# Patient Record
Sex: Female | Born: 1945 | Race: White | Hispanic: No | Smoking: Former smoker
Health system: Southern US, Community
[De-identification: ages and names within clinical notes are randomized; demographics above are authoritative.]

## PROBLEM LIST (undated history)

## (undated) DIAGNOSIS — E119 Type 2 diabetes mellitus without complications: Secondary | ICD-10-CM

## (undated) DIAGNOSIS — I10 Essential (primary) hypertension: Secondary | ICD-10-CM

## (undated) HISTORY — PX: CORONARY ARTERY BYPASS GRAFT: SHX141

## (undated) HISTORY — PX: ABDOMINAL HYSTERECTOMY: SHX81

## (undated) HISTORY — PX: CHOLECYSTECTOMY: SHX55

---

## 2018-07-22 DIAGNOSIS — E119 Type 2 diabetes mellitus without complications: Secondary | ICD-10-CM | POA: Insufficient documentation

## 2018-07-22 DIAGNOSIS — R0789 Other chest pain: Secondary | ICD-10-CM | POA: Insufficient documentation

## 2018-07-22 DIAGNOSIS — Z951 Presence of aortocoronary bypass graft: Secondary | ICD-10-CM | POA: Insufficient documentation

## 2018-07-22 DIAGNOSIS — Z87891 Personal history of nicotine dependence: Secondary | ICD-10-CM | POA: Diagnosis not present

## 2018-07-22 DIAGNOSIS — I10 Essential (primary) hypertension: Secondary | ICD-10-CM | POA: Insufficient documentation

## 2018-07-23 ENCOUNTER — Emergency Department (HOSPITAL_COMMUNITY)
Admission: EM | Admit: 2018-07-23 | Discharge: 2018-07-23 | Disposition: A | Discharge status: Home / Self Care | Payer: Medicare Other | Attending: Emergency Medicine | Admitting: Emergency Medicine

## 2018-07-23 ENCOUNTER — Encounter (HOSPITAL_COMMUNITY): Payer: Self-pay | Admitting: *Deleted

## 2018-07-23 ENCOUNTER — Other Ambulatory Visit: Payer: Self-pay

## 2018-07-23 ENCOUNTER — Emergency Department (HOSPITAL_COMMUNITY): Payer: Medicare Other

## 2018-07-23 DIAGNOSIS — R0789 Other chest pain: Secondary | ICD-10-CM | POA: Diagnosis not present

## 2018-07-23 HISTORY — DX: Type 2 diabetes mellitus without complications: E11.9

## 2018-07-23 HISTORY — DX: Essential (primary) hypertension: I10

## 2018-07-23 MED ORDER — METHOCARBAMOL 500 MG PO TABS: 500.0000 mg | ORAL_TABLET | Freq: Three times a day (TID) | ORAL | 0 refills | Status: AC | PRN

## 2018-07-23 MED ORDER — TRAMADOL HCL 50 MG PO TABS
50.0000 mg | ORAL_TABLET | Freq: Once | ORAL | Status: AC
  Administered 2018-07-23: 50 mg via ORAL
  Filled 2018-07-23: qty 1

## 2018-07-23 NOTE — ED Provider Notes (Signed)
[facility] EMERGENCY DEPARTMENT Provider Note   CSN: [id number] Arrival date & time: 07/22/18  2345  Time seen 1:40 AM   History   Chief Complaint Chief Complaint  Patient presents with  . Back Pain    HPI Heather Le is a 72 y.o. female.  HPI patient states she had pain in her right posterior chest area on December 23.  She states it is a dull pain in last 20 seconds and comes and goes.  She states she has had this many times before and she takes tramadol and it goes away.  She states she started getting a left sided chest pain posteriorly or in the same area but on the other side about 9 PM that woke her up from sleep.  She states it is stabbing and sharp and last about 10 seconds.  She denies feeling short of breath with it.  She states nothing she does makes it worse, nothing she does makes it better.  She denies any change in her activity or any known injury.  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  She denies hematuria, dysuria but she does have chronic frequency since she had a hysterectomy 2016.  She denies sore throat, rhinorrhea, she has had nausea without vomiting or diarrhea.  She also describes she has had a cough.  Patient states she normally takes tramadol for these pains however she took her last tramadol on the 24th.  She states if she had her tramadol at home she probably would not of come to the ED.  She states she normally has a blood pressure in the 1 30-1 45 range over 80.  She states tonight she took her blood pressure and it was 227/109.  She states she took her blood pressure medications today as usual, enalapril and Coreg.  She also takes glipizide for her diabetes.  PCP Monico Blitz, MD   Past Medical History:  Diagnosis Date  . Diabetes mellitus without complication ([place])   . Hypertension     There are no active problems to display for this patient.   Past Surgical History:  Procedure Laterality Date  . ABDOMINAL HYSTERECTOMY    .  CHOLECYSTECTOMY    . CORONARY ARTERY BYPASS GRAFT       OB History   No obstetric history on file.      Home Medications    Enalapril, Coreg, glipizide   Prior to Admission medications   Medication Sig Start Date End Date Taking? Authorizing Provider  methocarbamol (ROBAXIN) 500 MG tablet Take 1 tablet (500 mg total) by mouth every 8 (eight) hours as needed for muscle spasms. 07/23/18   Rolland Porter, MD    Family History History reviewed. No pertinent family history.  Social History Social History   Tobacco Use  . Smoking status: Former Research scientist (life sciences)  . Smokeless tobacco: Never Used  Substance Use Topics  . Alcohol use: Not Currently  . Drug use: Not Currently  Lives with her daughter   Allergies   Codeine; Lortab [hydrocodone-acetaminophen]; and Penicillins   Review of Systems Review of Systems  All other systems reviewed and are negative.    Physical Exam Updated Vital Signs BP (!) 166/91   Pulse 68   Temp 97.7 F (36.5 C) (Oral)   Resp 20   Ht 5\' 7"  (1.702 m)   Wt 89.8 kg   SpO2 98%   BMI 31.01 kg/m   Vital signs normal except mild hypertension   Physical  Exam Vitals signs and nursing note reviewed.  Constitutional:      General: She is not in acute distress.    Appearance: Normal appearance. She is well-developed. She is not ill-appearing or toxic-appearing.  HENT:     Head: Normocephalic and atraumatic.     Right Ear: External ear normal.     Left Ear: External ear normal.     Nose: Nose normal. No mucosal edema or rhinorrhea.     Mouth/Throat:     Dentition: No dental abscesses.     Pharynx: No uvula swelling.  Eyes:     Conjunctiva/sclera: Conjunctivae normal.     Pupils: Pupils are equal, round, and reactive to light.  Neck:     Musculoskeletal: Full passive range of motion without pain, normal range of motion and neck supple.  Cardiovascular:     Rate and Rhythm: Normal rate and regular rhythm.     Heart sounds: Normal heart sounds. No  murmur. No friction rub. No gallop.   Pulmonary:     Effort: Pulmonary effort is normal. No respiratory distress.     Breath sounds: Normal breath sounds. No wheezing, rhonchi or rales.       Comments: Patient's area of pain is noted and she has some mild tenderness to palpation.  There is no rash seen to suggest shingles. Chest:     Chest wall: No tenderness or crepitus.  Abdominal:     General: Bowel sounds are normal. There is no distension.     Palpations: Abdomen is soft.     Tenderness: There is no abdominal tenderness. There is no guarding or rebound.  Musculoskeletal: Normal range of motion.        General: No tenderness.     Comments: Moves all extremities well.   Skin:    General: Skin is warm and dry.     Coloration: Skin is not pale.     Findings: No erythema or rash.  Neurological:     Mental Status: She is alert and oriented to person, place, and time.     Cranial Nerves: No cranial nerve deficit.  Psychiatric:        Mood and Affect: Mood is not anxious.        Speech: Speech normal.        Behavior: Behavior normal.      ED Treatments / Results  Labs (all labs ordered are listed, but only abnormal results are displayed) Labs Reviewed - No data to display  EKG None  Radiology Dg Chest 2 View  Result Date: 07/23/2018 CLINICAL DATA:  Acute onset of left posterior chest pain. High blood pressure. EXAM: CHEST - 2 VIEW COMPARISON:  None. FINDINGS: The lungs are well-aerated. Vascular congestion is noted. Minimal right midlung and left basilar airspace opacities may reflect atelectasis or possibly mild infection. There is no evidence of pleural effusion or pneumothorax. The heart is normal in size; the patient is status post median sternotomy. No acute osseous abnormalities are seen. IMPRESSION: Vascular congestion noted. Minimal right midlung and left basilar airspace opacities may reflect atelectasis or possibly mild infection. Electronically Signed   By: Garald Balding M.D.   On: 07/23/2018 02:31    Procedures Procedures (including critical care time)  Medications Ordered in ED Medications  traMADol (ULTRAM) tablet 50 mg (50 mg Oral Given 07/23/18 0158)     Initial Impression / Assessment and Plan / ED Course  I have reviewed the triage vital signs and the nursing notes.  Pertinent labs & imaging results that were available during my care of the patient were reviewed by me and considered in my medical decision making (see chart for details).   Review of the [place] shows patient only has one prescription for tramadol # 60 tablets prescribed in September.  Due to the location for pain chest x-ray was done, patient was given tramadol in the ED.  When I checked her blood pressure during my exam it was 166/91 without treatment.  4:15 AM we discussed her chest x-ray.  She states she is only had a minimal cough no fever no shortness of breath.  She does not feel like she needs antibiotic at this time, she states she feels like she is having a lot of allergy symptoms.  She states her pain is better with her usual tramadol.  Her pain is very atypical and at this point I do not feel any further testing is indicated.  Final Clinical Impressions(s) / ED Diagnoses   Final diagnoses:  Posterior chest pain    ED Discharge Orders         Ordered    methocarbamol (ROBAXIN) 500 MG tablet  Every 8 hours PRN     07/23/18 0430         Plan discharge  Rolland Porter, MD, Barbette Or, MD 07/23/18 [phone]

## 2018-07-23 NOTE — Discharge Instructions (Addendum)
Take the medication as prescribed.  If you feel like you need to be back on your tramadol, please discuss this with Dr. Brigitte Pulse.  Recheck if you get fever, shortness of breath, your cough gets worse, or your pain gets more constant.

## 2018-07-23 NOTE — ED Triage Notes (Signed)
Pt c/o mid back pain for the last couple of days that has gotten progressively worse; pt states her BP has been elevated at home; BP at home was 227/109

## 2018-07-24 ENCOUNTER — Encounter (HOSPITAL_COMMUNITY): Payer: Self-pay | Admitting: Emergency Medicine

## 2018-07-24 ENCOUNTER — Emergency Department (HOSPITAL_COMMUNITY)
Admission: EM | Admit: 2018-07-24 | Discharge: 2018-07-24 | Disposition: A | Discharge status: Home / Self Care | Payer: Medicare Other | Attending: Emergency Medicine | Admitting: Emergency Medicine

## 2018-07-24 ENCOUNTER — Other Ambulatory Visit: Payer: Self-pay

## 2018-07-24 ENCOUNTER — Emergency Department (HOSPITAL_COMMUNITY): Payer: Medicare Other

## 2018-07-24 DIAGNOSIS — M545 Low back pain, unspecified: Secondary | ICD-10-CM

## 2018-07-24 DIAGNOSIS — I10 Essential (primary) hypertension: Secondary | ICD-10-CM | POA: Diagnosis not present

## 2018-07-24 DIAGNOSIS — M549 Dorsalgia, unspecified: Secondary | ICD-10-CM | POA: Diagnosis present

## 2018-07-24 DIAGNOSIS — E119 Type 2 diabetes mellitus without complications: Secondary | ICD-10-CM | POA: Diagnosis not present

## 2018-07-24 DIAGNOSIS — Z87891 Personal history of nicotine dependence: Secondary | ICD-10-CM | POA: Diagnosis not present

## 2018-07-24 DIAGNOSIS — R1084 Generalized abdominal pain: Secondary | ICD-10-CM | POA: Diagnosis not present

## 2018-07-24 LAB — URINALYSIS, ROUTINE W REFLEX MICROSCOPIC
Bilirubin Urine: NEGATIVE
Glucose, UA: NEGATIVE mg/dL
Ketones, ur: NEGATIVE mg/dL
Nitrite: NEGATIVE
Protein, ur: NEGATIVE mg/dL
Specific Gravity, Urine: 1.009 (ref 1.005–1.030)
pH: 5 (ref 5.0–8.0)

## 2018-07-24 MED ORDER — TRAMADOL HCL 50 MG PO TABS: 50.0000 mg | ORAL_TABLET | Freq: Four times a day (QID) | ORAL | 0 refills | Status: AC | PRN

## 2018-07-24 MED ORDER — TRAMADOL HCL 50 MG PO TABS
50.0000 mg | ORAL_TABLET | Freq: Once | ORAL | Status: AC
  Administered 2018-07-24: 50 mg via ORAL
  Filled 2018-07-24: qty 1

## 2018-07-24 NOTE — ED Provider Notes (Signed)
Work-up for the back pain without any significant abnormalities.  Will treat with a short course of Ultram until patient can follow-up with her doctor.  Urinalysis not consistent with urinary tract infection did have some white blood cells so sent for culture.  She will be notified if it grows a significant amount of bacteria.   Fredia Sorrow, MD 07/24/18 [phone]

## 2018-07-24 NOTE — ED Triage Notes (Signed)
Pt was seen 12/25 for lower back pain and states the ultram does not help her back pain. She states she wants some more of the med she received while she was here.

## 2018-07-24 NOTE — ED Provider Notes (Signed)
[facility] EMERGENCY DEPARTMENT Provider Note   CSN: [id number] Arrival date & time: 07/24/18  4081     History   Chief Complaint Chief Complaint  Patient presents with  . Back Pain    HPI Heather Le is a 72 y.o. female.  HPI Patient presents with left-sided mid back pain.  Seen 2 days ago for same.  Had previously been on Ultram for it.  Has had some pain on the right side for years but states now is on the left side.  Had been on both sides 2 days ago.  Normally Ultram will help but when she was here 2 days ago states it only partially relieves pain.  Given Robaxin and continued pain.  No fevers.  No dysuria.  No difficulty breathing.  He has had pains like this before but usually the pain is on the right side.  No diarrhea or constipation. Past Medical History:  Diagnosis Date  . Diabetes mellitus without complication ([place])   . Hypertension     There are no active problems to display for this patient.   Past Surgical History:  Procedure Laterality Date  . ABDOMINAL HYSTERECTOMY    . CHOLECYSTECTOMY    . CORONARY ARTERY BYPASS GRAFT       OB History   No obstetric history on file.      Home Medications    Prior to Admission medications   Medication Sig Start Date End Date Taking? Authorizing Provider  methocarbamol (ROBAXIN) 500 MG tablet Take 1 tablet (500 mg total) by mouth every 8 (eight) hours as needed for muscle spasms. 07/23/18   Rolland Porter, MD  traMADol (ULTRAM) 50 MG tablet Take 1 tablet (50 mg total) by mouth every 6 (six) hours as needed. 07/24/18   Fredia Sorrow, MD    Family History No family history on file.  Social History Social History   Tobacco Use  . Smoking status: Former Research scientist (life sciences)  . Smokeless tobacco: Never Used  Substance Use Topics  . Alcohol use: Not Currently  . Drug use: Not Currently     Allergies   Codeine; Lortab [hydrocodone-acetaminophen]; and Penicillins   Review of Systems Review of Systems    Constitutional: Negative for appetite change.  HENT: Negative for congestion.   Cardiovascular: Negative for leg swelling.  Gastrointestinal: Negative for abdominal pain.  Genitourinary: Positive for flank pain.  Musculoskeletal: Positive for back pain.  Neurological: Negative for weakness.  Psychiatric/Behavioral: Negative for confusion.     Physical Exam Updated Vital Signs BP (!) 153/74 (BP Location: Left Arm)   Pulse (!) 59   Temp (!) 97.5 F (36.4 C) (Oral)   Resp 14   Ht 5\' 7"  (1.702 m)   Wt 89.8 kg   SpO2 97%   BMI 31.01 kg/m   Physical Exam HENT:     Head: Normocephalic.     Mouth/Throat:     Mouth: Mucous membranes are dry.  Cardiovascular:     Rate and Rhythm: Normal rate.  Abdominal:     General: Abdomen is flat.     Tenderness: There is no abdominal tenderness.  Genitourinary:    Comments: CVA tenderness on left side. Musculoskeletal:        General: No tenderness.  Skin:    General: Skin is warm.     Capillary Refill: Capillary refill takes less than 2 seconds.  Neurological:     Mental Status: She is alert.  Psychiatric:        Mood and  Affect: Mood normal.      ED Treatments / Results  Labs (all labs ordered are listed, but only abnormal results are displayed) Labs Reviewed  URINALYSIS, ROUTINE W REFLEX MICROSCOPIC - Abnormal; Notable for the following components:      Result Value   APPearance HAZY (*)    Hgb urine dipstick SMALL (*)    Leukocytes, UA SMALL (*)    Bacteria, UA RARE (*)    All other components within normal limits  URINE CULTURE    EKG None  Radiology Ct Renal Stone Study  Result Date: 07/24/2018 CLINICAL DATA:  Acute onset of lower back pain. EXAM: CT ABDOMEN AND PELVIS WITHOUT CONTRAST TECHNIQUE: Multidetector CT imaging of the abdomen and pelvis was performed following the standard protocol without IV contrast. COMPARISON:  None. FINDINGS: Lower chest: The visualized lung bases are grossly clear. The visualized  portions of the mediastinum are unremarkable. Hepatobiliary: A small calcified granuloma is noted at the right hepatic lobe. The liver is otherwise unremarkable. The patient is status post cholecystectomy. The common bile duct is normal in caliber. Pancreas: The pancreas is within normal limits. Spleen: The spleen is unremarkable in appearance. Adrenals/Urinary Tract: The adrenal glands are unremarkable in appearance. Nonobstructing right renal stones measure up to 6 mm in size. There is severe left renal atrophy. No significant perinephric stranding is seen. No obstructing ureteral stones are identified. Stomach/Bowel: The stomach is unremarkable in appearance. The small bowel is within normal limits. The appendix is not visualized; there is no evidence for appendicitis. The colon is unremarkable in appearance. Vascular/Lymphatic: Scattered calcification is seen along the abdominal aorta and its branches. The abdominal aorta is otherwise grossly unremarkable. The inferior vena cava is grossly unremarkable. No retroperitoneal lymphadenopathy is seen. No pelvic sidewall lymphadenopathy is identified. Reproductive: The bladder is mildly distended and grossly unremarkable. The patient is status post hysterectomy. No suspicious adnexal masses are seen. Other: Note is made of a moderate to large left inguinal hernia, containing multiple segments of small bowel. There is no evidence for bowel obstruction. Musculoskeletal: No acute osseous abnormalities are identified. There is chronic loss of height at vertebral body L1. The visualized musculature is unremarkable in appearance. IMPRESSION: 1. No acute abnormality seen to explain the patient's symptoms. Chronic loss of height noted at vertebral body L1. 2. Moderate to large left inguinal hernia, containing multiple segments of small bowel. No evidence for bowel obstruction. 3. Severe left renal atrophy. Nonobstructing right renal stones measure up to 6 mm in size. Aortic  Atherosclerosis (ICD10-I70.0). Electronically Signed   By: Garald Balding M.D.   On: 07/24/2018 06:32    Procedures Procedures (including critical care time)  Medications Ordered in ED Medications  traMADol (ULTRAM) tablet 50 mg (50 mg Oral Given 07/24/18 7035)     Initial Impression / Assessment and Plan / ED Course  I have reviewed the triage vital signs and the nursing notes.  Pertinent labs & imaging results that were available during my care of the patient were reviewed by me and considered in my medical decision making (see chart for details).     Patient with back pain.  CT scan reassuring.  Urinalysis pending.  Likely musculoskeletal.  No rash.  Care signed out to Dr. Rogene Houston.  Final Clinical Impressions(s) / ED Diagnoses   Final diagnoses:  Acute low back pain without sciatica, unspecified back pain laterality    ED Discharge Orders         Ordered  traMADol (ULTRAM) 50 MG tablet  Every 6 hours PRN     07/24/18 0935           Davonna Belling, MD 07/25/18 [phone]

## 2018-07-24 NOTE — Discharge Instructions (Signed)
Work-up for the back pain without any significant findings.  Urine not consistent with urinary tract infection however urine culture has been sent and if it grows significant amount of bacteria you will be contacted.  Take the Ultram as needed for pain follow-up with your doctors.

## 2018-07-26 LAB — URINE CULTURE: Culture: >=100000 — AB

## 2018-07-27 ENCOUNTER — Telehealth: Payer: Self-pay | Admitting: Emergency Medicine

## 2018-07-27 NOTE — Telephone Encounter (Incomplete)
Post ED Visit - Positive Culture Follow-up: Successful Patient Follow-Up  Culture assessed and recommendations reviewed by:  []  Elenor Quinones, Pharm.D. []  Heide Guile, Pharm.D., BCPS AQ-ID []  Parks Neptune, Pharm.D., BCPS []  Alycia Rossetti, Pharm.D., BCPS []  Norton, Pharm.D., BCPS, AAHIVP []  Legrand Como, Pharm.D., BCPS, AAHIVP [x]  Salome Arnt, PharmD, BCPS []  Johnnette Gourd, PharmD, BCPS []  Hughes Better, PharmD, BCPS []  Leeroy Cha, PharmD  Positive Urine culture  [x]  Patient discharged without antimicrobial prescription and treatment is now indicated []  Organism is resistant to prescribed ED discharge antimicrobial []  Patient with positive blood cultures  *** if symptomatic: Changes discussed with ED provider: Okey Regal PA New antibiotic prescription Bactrim DS one tab Po BID x seven days   Contacted patient, date 07/27/2018, time 1000 Patient denies any symptoms, reports "feeling much better". No further treatment needed.   Larene Beach Tiffiney Sparrow 07/27/2018, 10:05 AM

## 2018-07-27 NOTE — Progress Notes (Signed)
ED Antimicrobial Stewardship Positive Culture Follow Up   Heather Le is an 72 y.o. female who presented to [facility] on 07/24/2018 with a chief complaint of  Chief Complaint  Patient presents with  . Back Pain    Recent Results (from the past 720 hour(s))  Urine Culture     Status: Abnormal   Collection Time: 07/24/18  9:04 AM  Result Value Ref Range Status   Specimen Description   Final    URINE, CLEAN CATCH Performed at [facility], [address], [place], [place] [address]    Special Requests   Final    NONE Performed at [facility], [address], [place], [place] [address]    Culture >=100,000 COLONIES/mL KLEBSIELLA PNEUMONIAE (A)  Final   Report Status 07/26/2018 FINAL  Final   Organism ID, Bacteria KLEBSIELLA PNEUMONIAE (A)  Final      Susceptibility   Klebsiella pneumoniae - MIC*    AMPICILLIN RESISTANT Resistant     CEFAZOLIN <=4 SENSITIVE Sensitive     CEFTRIAXONE <=1 SENSITIVE Sensitive     CIPROFLOXACIN <=0.25 SENSITIVE Sensitive     GENTAMICIN <=1 SENSITIVE Sensitive     IMIPENEM <=0.25 SENSITIVE Sensitive     NITROFURANTOIN 64 INTERMEDIATE Intermediate     TRIMETH/SULFA <=20 SENSITIVE Sensitive     AMPICILLIN/SULBACTAM <=2 SENSITIVE Sensitive     PIP/TAZO <=4 SENSITIVE Sensitive     Extended ESBL NEGATIVE Sensitive     * >=100,000 COLONIES/mL KLEBSIELLA PNEUMONIAE    []  Treated with N/A, organism resistant to prescribed antimicrobial [x]  Patient discharged originally without antimicrobial agent and treatment is now indicated  New antibiotic prescription: If pt with UTI symptoms, start bactrim DS 1 tablet PO BID x 7 days  ED Provider: Lenn Sink, PA   Heather Le, Heather Le 07/27/2018, 8:29 AM Clinical Pharmacist Monday - Friday phone -  [phone] Saturday - Sunday phone - [phone]

## 2019-03-30 ENCOUNTER — Other Ambulatory Visit (HOSPITAL_COMMUNITY): Payer: Self-pay | Admitting: Oncology

## 2019-03-30 DIAGNOSIS — M899 Disorder of bone, unspecified: Secondary | ICD-10-CM

## 2019-03-30 DIAGNOSIS — C251 Malignant neoplasm of body of pancreas: Secondary | ICD-10-CM

## 2019-04-12 ENCOUNTER — Ambulatory Visit (HOSPITAL_COMMUNITY): Payer: Medicare Other

## 2019-04-12 ENCOUNTER — Encounter (HOSPITAL_COMMUNITY): Payer: Self-pay

## 2019-08-17 DIAGNOSIS — D472 Monoclonal gammopathy: Secondary | ICD-10-CM | POA: Diagnosis not present

## 2019-08-17 DIAGNOSIS — C9000 Multiple myeloma not having achieved remission: Secondary | ICD-10-CM | POA: Diagnosis not present

## 2019-08-23 DIAGNOSIS — C9000 Multiple myeloma not having achieved remission: Secondary | ICD-10-CM | POA: Diagnosis not present

## 2019-08-23 DIAGNOSIS — G893 Neoplasm related pain (acute) (chronic): Secondary | ICD-10-CM | POA: Diagnosis not present

## 2019-08-31 DIAGNOSIS — D472 Monoclonal gammopathy: Secondary | ICD-10-CM | POA: Diagnosis not present

## 2019-08-31 DIAGNOSIS — Z9889 Other specified postprocedural states: Secondary | ICD-10-CM | POA: Diagnosis not present

## 2019-08-31 DIAGNOSIS — C9031 Solitary plasmacytoma in remission: Secondary | ICD-10-CM | POA: Diagnosis not present

## 2019-08-31 DIAGNOSIS — G893 Neoplasm related pain (acute) (chronic): Secondary | ICD-10-CM | POA: Diagnosis not present

## 2019-09-06 DIAGNOSIS — E1165 Type 2 diabetes mellitus with hyperglycemia: Secondary | ICD-10-CM | POA: Diagnosis not present

## 2019-09-06 DIAGNOSIS — Z0000 Encounter for general adult medical examination without abnormal findings: Secondary | ICD-10-CM | POA: Diagnosis not present

## 2019-09-06 DIAGNOSIS — R5383 Other fatigue: Secondary | ICD-10-CM | POA: Diagnosis not present

## 2019-09-06 DIAGNOSIS — E7800 Pure hypercholesterolemia, unspecified: Secondary | ICD-10-CM | POA: Diagnosis not present

## 2019-09-06 DIAGNOSIS — E1122 Type 2 diabetes mellitus with diabetic chronic kidney disease: Secondary | ICD-10-CM | POA: Diagnosis not present

## 2019-09-06 DIAGNOSIS — E559 Vitamin D deficiency, unspecified: Secondary | ICD-10-CM | POA: Diagnosis not present

## 2019-09-06 DIAGNOSIS — Z79899 Other long term (current) drug therapy: Secondary | ICD-10-CM | POA: Diagnosis not present

## 2019-09-06 DIAGNOSIS — Z299 Encounter for prophylactic measures, unspecified: Secondary | ICD-10-CM | POA: Diagnosis not present

## 2019-09-06 DIAGNOSIS — Z1211 Encounter for screening for malignant neoplasm of colon: Secondary | ICD-10-CM | POA: Diagnosis not present

## 2019-09-06 DIAGNOSIS — Z7189 Other specified counseling: Secondary | ICD-10-CM | POA: Diagnosis not present

## 2019-10-24 DIAGNOSIS — Z8579 Personal history of other malignant neoplasms of lymphoid, hematopoietic and related tissues: Secondary | ICD-10-CM | POA: Diagnosis not present

## 2019-10-24 DIAGNOSIS — I2510 Atherosclerotic heart disease of native coronary artery without angina pectoris: Secondary | ICD-10-CM | POA: Diagnosis not present

## 2019-10-24 DIAGNOSIS — M940 Chondrocostal junction syndrome [Tietze]: Secondary | ICD-10-CM | POA: Diagnosis not present

## 2019-10-24 DIAGNOSIS — Z880 Allergy status to penicillin: Secondary | ICD-10-CM | POA: Diagnosis not present

## 2019-10-24 DIAGNOSIS — I10 Essential (primary) hypertension: Secondary | ICD-10-CM | POA: Diagnosis not present

## 2019-10-24 DIAGNOSIS — R079 Chest pain, unspecified: Secondary | ICD-10-CM | POA: Diagnosis not present

## 2019-10-24 DIAGNOSIS — M79621 Pain in right upper arm: Secondary | ICD-10-CM | POA: Diagnosis not present

## 2019-10-24 DIAGNOSIS — Z886 Allergy status to analgesic agent status: Secondary | ICD-10-CM | POA: Diagnosis not present

## 2019-10-24 DIAGNOSIS — R110 Nausea: Secondary | ICD-10-CM | POA: Diagnosis not present

## 2019-10-24 DIAGNOSIS — E1122 Type 2 diabetes mellitus with diabetic chronic kidney disease: Secondary | ICD-10-CM | POA: Diagnosis not present

## 2019-10-24 DIAGNOSIS — Z7982 Long term (current) use of aspirin: Secondary | ICD-10-CM | POA: Diagnosis not present

## 2019-10-24 DIAGNOSIS — K219 Gastro-esophageal reflux disease without esophagitis: Secondary | ICD-10-CM | POA: Diagnosis not present

## 2019-10-24 DIAGNOSIS — Z7984 Long term (current) use of oral hypoglycemic drugs: Secondary | ICD-10-CM | POA: Diagnosis not present

## 2019-10-24 DIAGNOSIS — Z87891 Personal history of nicotine dependence: Secondary | ICD-10-CM | POA: Diagnosis not present

## 2019-10-24 DIAGNOSIS — Z955 Presence of coronary angioplasty implant and graft: Secondary | ICD-10-CM | POA: Diagnosis not present

## 2019-10-24 DIAGNOSIS — Z888 Allergy status to other drugs, medicaments and biological substances status: Secondary | ICD-10-CM | POA: Diagnosis not present

## 2019-10-24 DIAGNOSIS — M25511 Pain in right shoulder: Secondary | ICD-10-CM | POA: Diagnosis not present

## 2019-10-24 DIAGNOSIS — E7800 Pure hypercholesterolemia, unspecified: Secondary | ICD-10-CM | POA: Diagnosis not present

## 2019-10-24 DIAGNOSIS — I129 Hypertensive chronic kidney disease with stage 1 through stage 4 chronic kidney disease, or unspecified chronic kidney disease: Secondary | ICD-10-CM | POA: Diagnosis not present

## 2019-10-24 DIAGNOSIS — R0789 Other chest pain: Secondary | ICD-10-CM | POA: Diagnosis not present

## 2019-10-24 DIAGNOSIS — Z79899 Other long term (current) drug therapy: Secondary | ICD-10-CM | POA: Diagnosis not present

## 2019-10-24 DIAGNOSIS — N189 Chronic kidney disease, unspecified: Secondary | ICD-10-CM | POA: Diagnosis not present

## 2019-10-24 DIAGNOSIS — Z9049 Acquired absence of other specified parts of digestive tract: Secondary | ICD-10-CM | POA: Diagnosis not present

## 2019-10-24 DIAGNOSIS — Z885 Allergy status to narcotic agent status: Secondary | ICD-10-CM | POA: Diagnosis not present

## 2019-10-24 DIAGNOSIS — Z20822 Contact with and (suspected) exposure to covid-19: Secondary | ICD-10-CM | POA: Diagnosis not present

## 2019-10-24 DIAGNOSIS — R7989 Other specified abnormal findings of blood chemistry: Secondary | ICD-10-CM | POA: Diagnosis not present

## 2019-10-24 DIAGNOSIS — E119 Type 2 diabetes mellitus without complications: Secondary | ICD-10-CM | POA: Diagnosis not present

## 2019-10-24 DIAGNOSIS — Z951 Presence of aortocoronary bypass graft: Secondary | ICD-10-CM | POA: Diagnosis not present

## 2019-10-24 DIAGNOSIS — R001 Bradycardia, unspecified: Secondary | ICD-10-CM | POA: Diagnosis not present

## 2019-10-25 DIAGNOSIS — M25511 Pain in right shoulder: Secondary | ICD-10-CM | POA: Diagnosis not present

## 2019-10-25 DIAGNOSIS — Z20822 Contact with and (suspected) exposure to covid-19: Secondary | ICD-10-CM | POA: Diagnosis not present

## 2019-10-25 DIAGNOSIS — R0789 Other chest pain: Secondary | ICD-10-CM | POA: Diagnosis not present

## 2019-10-25 DIAGNOSIS — E1122 Type 2 diabetes mellitus with diabetic chronic kidney disease: Secondary | ICD-10-CM | POA: Diagnosis not present

## 2019-10-25 DIAGNOSIS — I129 Hypertensive chronic kidney disease with stage 1 through stage 4 chronic kidney disease, or unspecified chronic kidney disease: Secondary | ICD-10-CM | POA: Diagnosis not present

## 2019-10-25 DIAGNOSIS — Z888 Allergy status to other drugs, medicaments and biological substances status: Secondary | ICD-10-CM | POA: Diagnosis not present

## 2019-10-25 DIAGNOSIS — M79621 Pain in right upper arm: Secondary | ICD-10-CM | POA: Diagnosis not present

## 2019-10-25 DIAGNOSIS — Z886 Allergy status to analgesic agent status: Secondary | ICD-10-CM | POA: Diagnosis not present

## 2019-10-25 DIAGNOSIS — I2510 Atherosclerotic heart disease of native coronary artery without angina pectoris: Secondary | ICD-10-CM | POA: Diagnosis not present

## 2019-10-25 DIAGNOSIS — R7989 Other specified abnormal findings of blood chemistry: Secondary | ICD-10-CM | POA: Diagnosis not present

## 2019-10-25 DIAGNOSIS — N189 Chronic kidney disease, unspecified: Secondary | ICD-10-CM | POA: Diagnosis not present

## 2019-10-25 DIAGNOSIS — Z880 Allergy status to penicillin: Secondary | ICD-10-CM | POA: Diagnosis not present

## 2019-10-25 DIAGNOSIS — R079 Chest pain, unspecified: Secondary | ICD-10-CM | POA: Diagnosis not present

## 2019-10-25 DIAGNOSIS — Z9049 Acquired absence of other specified parts of digestive tract: Secondary | ICD-10-CM | POA: Diagnosis not present

## 2019-10-25 DIAGNOSIS — Z8579 Personal history of other malignant neoplasms of lymphoid, hematopoietic and related tissues: Secondary | ICD-10-CM | POA: Diagnosis not present

## 2019-10-25 DIAGNOSIS — Z79899 Other long term (current) drug therapy: Secondary | ICD-10-CM | POA: Diagnosis not present

## 2019-10-25 DIAGNOSIS — K219 Gastro-esophageal reflux disease without esophagitis: Secondary | ICD-10-CM | POA: Diagnosis not present

## 2019-10-25 DIAGNOSIS — Z87891 Personal history of nicotine dependence: Secondary | ICD-10-CM | POA: Diagnosis not present

## 2019-10-25 DIAGNOSIS — E7800 Pure hypercholesterolemia, unspecified: Secondary | ICD-10-CM | POA: Diagnosis not present

## 2019-10-25 DIAGNOSIS — Z7982 Long term (current) use of aspirin: Secondary | ICD-10-CM | POA: Diagnosis not present

## 2019-10-25 DIAGNOSIS — Z951 Presence of aortocoronary bypass graft: Secondary | ICD-10-CM | POA: Diagnosis not present

## 2019-10-25 DIAGNOSIS — Z7984 Long term (current) use of oral hypoglycemic drugs: Secondary | ICD-10-CM | POA: Diagnosis not present

## 2019-10-26 DIAGNOSIS — E1122 Type 2 diabetes mellitus with diabetic chronic kidney disease: Secondary | ICD-10-CM | POA: Diagnosis not present

## 2019-10-26 DIAGNOSIS — I129 Hypertensive chronic kidney disease with stage 1 through stage 4 chronic kidney disease, or unspecified chronic kidney disease: Secondary | ICD-10-CM | POA: Diagnosis not present

## 2019-10-26 DIAGNOSIS — R0789 Other chest pain: Secondary | ICD-10-CM | POA: Diagnosis not present

## 2019-10-26 DIAGNOSIS — R079 Chest pain, unspecified: Secondary | ICD-10-CM | POA: Diagnosis not present

## 2019-10-30 DIAGNOSIS — Z7982 Long term (current) use of aspirin: Secondary | ICD-10-CM | POA: Diagnosis not present

## 2019-10-30 DIAGNOSIS — M25519 Pain in unspecified shoulder: Secondary | ICD-10-CM | POA: Diagnosis not present

## 2019-10-30 DIAGNOSIS — I10 Essential (primary) hypertension: Secondary | ICD-10-CM | POA: Diagnosis not present

## 2019-10-30 DIAGNOSIS — I2510 Atherosclerotic heart disease of native coronary artery without angina pectoris: Secondary | ICD-10-CM | POA: Diagnosis not present

## 2019-10-30 DIAGNOSIS — K219 Gastro-esophageal reflux disease without esophagitis: Secondary | ICD-10-CM | POA: Diagnosis not present

## 2019-10-30 DIAGNOSIS — Z7984 Long term (current) use of oral hypoglycemic drugs: Secondary | ICD-10-CM | POA: Diagnosis not present

## 2019-10-30 DIAGNOSIS — E119 Type 2 diabetes mellitus without complications: Secondary | ICD-10-CM | POA: Diagnosis not present

## 2019-10-30 DIAGNOSIS — M5412 Radiculopathy, cervical region: Secondary | ICD-10-CM | POA: Diagnosis not present

## 2019-10-30 DIAGNOSIS — W19XXXA Unspecified fall, initial encounter: Secondary | ICD-10-CM | POA: Diagnosis not present

## 2019-10-30 DIAGNOSIS — Z87891 Personal history of nicotine dependence: Secondary | ICD-10-CM | POA: Diagnosis not present

## 2019-10-30 DIAGNOSIS — E7800 Pure hypercholesterolemia, unspecified: Secondary | ICD-10-CM | POA: Diagnosis not present

## 2019-10-30 DIAGNOSIS — M25511 Pain in right shoulder: Secondary | ICD-10-CM | POA: Diagnosis not present

## 2019-10-30 DIAGNOSIS — Z743 Need for continuous supervision: Secondary | ICD-10-CM | POA: Diagnosis not present

## 2019-10-30 DIAGNOSIS — Z951 Presence of aortocoronary bypass graft: Secondary | ICD-10-CM | POA: Diagnosis not present

## 2019-10-30 DIAGNOSIS — Z79899 Other long term (current) drug therapy: Secondary | ICD-10-CM | POA: Diagnosis not present

## 2019-10-31 DIAGNOSIS — I10 Essential (primary) hypertension: Secondary | ICD-10-CM | POA: Diagnosis not present

## 2019-10-31 DIAGNOSIS — M549 Dorsalgia, unspecified: Secondary | ICD-10-CM | POA: Diagnosis not present

## 2019-10-31 DIAGNOSIS — Z87891 Personal history of nicotine dependence: Secondary | ICD-10-CM | POA: Diagnosis not present

## 2019-10-31 DIAGNOSIS — Z79899 Other long term (current) drug therapy: Secondary | ICD-10-CM | POA: Diagnosis not present

## 2019-10-31 DIAGNOSIS — C9000 Multiple myeloma not having achieved remission: Secondary | ICD-10-CM | POA: Diagnosis not present

## 2019-10-31 DIAGNOSIS — N390 Urinary tract infection, site not specified: Secondary | ICD-10-CM | POA: Diagnosis not present

## 2019-10-31 DIAGNOSIS — E119 Type 2 diabetes mellitus without complications: Secondary | ICD-10-CM | POA: Diagnosis not present

## 2019-11-01 DIAGNOSIS — E119 Type 2 diabetes mellitus without complications: Secondary | ICD-10-CM | POA: Diagnosis not present

## 2019-11-01 DIAGNOSIS — E1122 Type 2 diabetes mellitus with diabetic chronic kidney disease: Secondary | ICD-10-CM | POA: Diagnosis not present

## 2019-11-01 DIAGNOSIS — M79641 Pain in right hand: Secondary | ICD-10-CM | POA: Diagnosis not present

## 2019-11-01 DIAGNOSIS — Z7984 Long term (current) use of oral hypoglycemic drugs: Secondary | ICD-10-CM | POA: Diagnosis not present

## 2019-11-01 DIAGNOSIS — Z87891 Personal history of nicotine dependence: Secondary | ICD-10-CM | POA: Diagnosis not present

## 2019-11-01 DIAGNOSIS — Z79899 Other long term (current) drug therapy: Secondary | ICD-10-CM | POA: Diagnosis not present

## 2019-11-01 DIAGNOSIS — Z7982 Long term (current) use of aspirin: Secondary | ICD-10-CM | POA: Diagnosis not present

## 2019-11-01 DIAGNOSIS — K219 Gastro-esophageal reflux disease without esophagitis: Secondary | ICD-10-CM | POA: Diagnosis not present

## 2019-11-01 DIAGNOSIS — M542 Cervicalgia: Secondary | ICD-10-CM | POA: Diagnosis not present

## 2019-11-01 DIAGNOSIS — Z789 Other specified health status: Secondary | ICD-10-CM | POA: Diagnosis not present

## 2019-11-01 DIAGNOSIS — C9000 Multiple myeloma not having achieved remission: Secondary | ICD-10-CM | POA: Diagnosis not present

## 2019-11-01 DIAGNOSIS — Z299 Encounter for prophylactic measures, unspecified: Secondary | ICD-10-CM | POA: Diagnosis not present

## 2019-11-01 DIAGNOSIS — E1165 Type 2 diabetes mellitus with hyperglycemia: Secondary | ICD-10-CM | POA: Diagnosis not present

## 2019-11-01 DIAGNOSIS — E7800 Pure hypercholesterolemia, unspecified: Secondary | ICD-10-CM | POA: Diagnosis not present

## 2019-11-01 DIAGNOSIS — I10 Essential (primary) hypertension: Secondary | ICD-10-CM | POA: Diagnosis not present

## 2019-11-04 DIAGNOSIS — G893 Neoplasm related pain (acute) (chronic): Secondary | ICD-10-CM | POA: Diagnosis not present

## 2019-11-04 DIAGNOSIS — C9000 Multiple myeloma not having achieved remission: Secondary | ICD-10-CM | POA: Diagnosis not present

## 2019-11-04 DIAGNOSIS — M8458XA Pathological fracture in neoplastic disease, other specified site, initial encounter for fracture: Secondary | ICD-10-CM | POA: Diagnosis not present

## 2019-11-05 DIAGNOSIS — C9000 Multiple myeloma not having achieved remission: Secondary | ICD-10-CM | POA: Diagnosis not present

## 2019-11-05 DIAGNOSIS — G893 Neoplasm related pain (acute) (chronic): Secondary | ICD-10-CM | POA: Diagnosis not present

## 2019-11-08 ENCOUNTER — Emergency Department (HOSPITAL_COMMUNITY)
Admission: EM | Admit: 2019-11-08 | Discharge: 2019-11-09 | Discharge status: Against Medical Advice | Payer: Medicare Other | Attending: Emergency Medicine | Admitting: Emergency Medicine

## 2019-11-08 DIAGNOSIS — Z5321 Procedure and treatment not carried out due to patient leaving prior to being seen by health care provider: Secondary | ICD-10-CM | POA: Insufficient documentation

## 2019-11-08 DIAGNOSIS — G893 Neoplasm related pain (acute) (chronic): Secondary | ICD-10-CM | POA: Diagnosis not present

## 2019-11-08 DIAGNOSIS — C9000 Multiple myeloma not having achieved remission: Secondary | ICD-10-CM | POA: Diagnosis not present

## 2019-11-08 DIAGNOSIS — M542 Cervicalgia: Secondary | ICD-10-CM | POA: Insufficient documentation

## 2019-11-08 DIAGNOSIS — M8458XA Pathological fracture in neoplastic disease, other specified site, initial encounter for fracture: Secondary | ICD-10-CM | POA: Diagnosis not present

## 2019-11-08 LAB — BASIC METABOLIC PANEL
Anion gap: 8 (ref 5–15)
BUN: 46 mg/dL — ABNORMAL HIGH (ref 8–23)
CO2: 18 mmol/L — ABNORMAL LOW (ref 22–32)
Calcium: 9.9 mg/dL (ref 8.9–10.3)
Chloride: 108 mmol/L (ref 98–111)
Creatinine, Ser: 1.41 mg/dL — ABNORMAL HIGH (ref 0.44–1.00)
GFR calc Af Amer: 42 mL/min — ABNORMAL LOW (ref >60)
GFR calc non Af Amer: 36 mL/min — ABNORMAL LOW (ref >60)
Glucose, Bld: 209 mg/dL — ABNORMAL HIGH (ref 70–99)
Potassium: 5.4 mmol/L — ABNORMAL HIGH (ref 3.5–5.1)
Sodium: 134 mmol/L — ABNORMAL LOW (ref 135–145)

## 2019-11-08 LAB — CBC
HCT: 41.6 % (ref 36.0–46.0)
Hemoglobin: 13.2 g/dL (ref 12.0–15.0)
MCH: 28 pg (ref 26.0–34.0)
MCHC: 31.7 g/dL (ref 30.0–36.0)
MCV: 88.1 fL (ref 80.0–100.0)
Platelets: 304 10*3/uL (ref 150–400)
RBC: 4.72 MIL/uL (ref 3.87–5.11)
RDW: 14.5 % (ref 11.5–15.5)
WBC: 14.6 10*3/uL — ABNORMAL HIGH (ref 4.0–10.5)
nRBC: 0 % (ref 0.0–0.2)

## 2019-11-08 NOTE — ED Triage Notes (Signed)
Pt in POV. Just seen at oncology office, was told to go to [facility] for admission but they had no beds so was told to come here instead for admission. Pt has hx of multiple myeloma, scheduled to have kyphoplasty to address the tumor in neck.

## 2019-11-08 NOTE — ED Provider Notes (Signed)
Dr. Rayetta Pigg from [place] center called to give me a report on the patient.  I am currently working at [facility] and anticipated the patient will be coming here but she is at [facility].   Patient has history of multiple myeloma.  She has been suffering from severe and intractable right arm pain.  No relief with dexamethasone and Vicodin.  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  Per Dr. Rayetta Pigg, patient's pain cannot be controlled and she may need emergent neuro surgical intervention.  She recently is starting to experience right hand or arm weakness.  Unfortunately, they have been unable to do MRI due to claustrophobia and severe pain.  Will notify EDP at [facility] of the patient.   Charlesetta Shanks, MD 11/08/19 1754

## 2019-11-08 NOTE — ED Triage Notes (Signed)
Re draw CBC 

## 2019-11-09 DIAGNOSIS — Z8639 Personal history of other endocrine, nutritional and metabolic disease: Secondary | ICD-10-CM | POA: Diagnosis not present

## 2019-11-09 DIAGNOSIS — G9520 Unspecified cord compression: Secondary | ICD-10-CM | POA: Diagnosis not present

## 2019-11-09 DIAGNOSIS — E119 Type 2 diabetes mellitus without complications: Secondary | ICD-10-CM | POA: Diagnosis not present

## 2019-11-09 DIAGNOSIS — K5900 Constipation, unspecified: Secondary | ICD-10-CM | POA: Diagnosis not present

## 2019-11-09 DIAGNOSIS — E875 Hyperkalemia: Secondary | ICD-10-CM | POA: Diagnosis not present

## 2019-11-09 DIAGNOSIS — M50221 Other cervical disc displacement at C4-C5 level: Secondary | ICD-10-CM | POA: Diagnosis not present

## 2019-11-09 DIAGNOSIS — Z951 Presence of aortocoronary bypass graft: Secondary | ICD-10-CM | POA: Diagnosis not present

## 2019-11-09 DIAGNOSIS — C9000 Multiple myeloma not having achieved remission: Secondary | ICD-10-CM | POA: Diagnosis not present

## 2019-11-09 DIAGNOSIS — E872 Acidosis: Secondary | ICD-10-CM | POA: Diagnosis not present

## 2019-11-09 DIAGNOSIS — I129 Hypertensive chronic kidney disease with stage 1 through stage 4 chronic kidney disease, or unspecified chronic kidney disease: Secondary | ICD-10-CM | POA: Diagnosis not present

## 2019-11-09 DIAGNOSIS — Z87891 Personal history of nicotine dependence: Secondary | ICD-10-CM | POA: Diagnosis not present

## 2019-11-09 DIAGNOSIS — Z510 Encounter for antineoplastic radiation therapy: Secondary | ICD-10-CM | POA: Diagnosis not present

## 2019-11-09 DIAGNOSIS — I2510 Atherosclerotic heart disease of native coronary artery without angina pectoris: Secondary | ICD-10-CM | POA: Diagnosis not present

## 2019-11-09 DIAGNOSIS — M79601 Pain in right arm: Secondary | ICD-10-CM | POA: Diagnosis not present

## 2019-11-09 DIAGNOSIS — Z515 Encounter for palliative care: Secondary | ICD-10-CM | POA: Diagnosis not present

## 2019-11-09 DIAGNOSIS — E1122 Type 2 diabetes mellitus with diabetic chronic kidney disease: Secondary | ICD-10-CM | POA: Diagnosis not present

## 2019-11-09 DIAGNOSIS — M899 Disorder of bone, unspecified: Secondary | ICD-10-CM | POA: Diagnosis not present

## 2019-11-09 DIAGNOSIS — E785 Hyperlipidemia, unspecified: Secondary | ICD-10-CM | POA: Diagnosis not present

## 2019-11-09 DIAGNOSIS — C720 Malignant neoplasm of spinal cord: Secondary | ICD-10-CM | POA: Diagnosis not present

## 2019-11-09 DIAGNOSIS — M47817 Spondylosis without myelopathy or radiculopathy, lumbosacral region: Secondary | ICD-10-CM | POA: Diagnosis not present

## 2019-11-09 DIAGNOSIS — K219 Gastro-esophageal reflux disease without esophagitis: Secondary | ICD-10-CM | POA: Diagnosis not present

## 2019-11-09 DIAGNOSIS — E1165 Type 2 diabetes mellitus with hyperglycemia: Secondary | ICD-10-CM | POA: Diagnosis not present

## 2019-11-09 DIAGNOSIS — Z7952 Long term (current) use of systemic steroids: Secondary | ICD-10-CM | POA: Diagnosis not present

## 2019-11-09 DIAGNOSIS — M8448XD Pathological fracture, other site, subsequent encounter for fracture with routine healing: Secondary | ICD-10-CM | POA: Diagnosis not present

## 2019-11-09 DIAGNOSIS — C9020 Extramedullary plasmacytoma not having achieved remission: Secondary | ICD-10-CM | POA: Diagnosis not present

## 2019-11-09 DIAGNOSIS — N183 Chronic kidney disease, stage 3 unspecified: Secondary | ICD-10-CM | POA: Diagnosis not present

## 2019-11-09 DIAGNOSIS — Z923 Personal history of irradiation: Secondary | ICD-10-CM | POA: Diagnosis not present

## 2019-11-09 DIAGNOSIS — G893 Neoplasm related pain (acute) (chronic): Secondary | ICD-10-CM | POA: Diagnosis not present

## 2019-11-09 DIAGNOSIS — M8458XA Pathological fracture in neoplastic disease, other specified site, initial encounter for fracture: Secondary | ICD-10-CM | POA: Diagnosis not present

## 2019-11-09 DIAGNOSIS — M4802 Spinal stenosis, cervical region: Secondary | ICD-10-CM | POA: Diagnosis not present

## 2019-11-09 DIAGNOSIS — R001 Bradycardia, unspecified: Secondary | ICD-10-CM | POA: Diagnosis not present

## 2019-11-09 DIAGNOSIS — E871 Hypo-osmolality and hyponatremia: Secondary | ICD-10-CM | POA: Diagnosis not present

## 2019-11-09 DIAGNOSIS — M50021 Cervical disc disorder at C4-C5 level with myelopathy: Secondary | ICD-10-CM | POA: Diagnosis not present

## 2019-11-09 DIAGNOSIS — M47812 Spondylosis without myelopathy or radiculopathy, cervical region: Secondary | ICD-10-CM | POA: Diagnosis not present

## 2019-11-09 DIAGNOSIS — M5126 Other intervertebral disc displacement, lumbar region: Secondary | ICD-10-CM | POA: Diagnosis not present

## 2019-11-09 DIAGNOSIS — M8448XA Pathological fracture, other site, initial encounter for fracture: Secondary | ICD-10-CM | POA: Diagnosis not present

## 2019-11-09 DIAGNOSIS — M4856XA Collapsed vertebra, not elsewhere classified, lumbar region, initial encounter for fracture: Secondary | ICD-10-CM | POA: Diagnosis not present

## 2019-11-09 DIAGNOSIS — C9030 Solitary plasmacytoma not having achieved remission: Secondary | ICD-10-CM | POA: Diagnosis not present

## 2019-11-09 DIAGNOSIS — I10 Essential (primary) hypertension: Secondary | ICD-10-CM | POA: Diagnosis not present

## 2019-11-09 NOTE — ED Notes (Signed)
Pt stating she is not willing to wait an longer and decided to leave. Pt encouraged to stay, but refused to.

## 2019-11-12 DIAGNOSIS — C9000 Multiple myeloma not having achieved remission: Secondary | ICD-10-CM | POA: Diagnosis not present

## 2019-11-12 DIAGNOSIS — C9020 Extramedullary plasmacytoma not having achieved remission: Secondary | ICD-10-CM | POA: Diagnosis not present

## 2019-11-12 DIAGNOSIS — Z510 Encounter for antineoplastic radiation therapy: Secondary | ICD-10-CM | POA: Diagnosis not present

## 2019-11-13 DIAGNOSIS — C9000 Multiple myeloma not having achieved remission: Secondary | ICD-10-CM | POA: Diagnosis not present

## 2019-11-14 DIAGNOSIS — C9000 Multiple myeloma not having achieved remission: Secondary | ICD-10-CM | POA: Diagnosis not present

## 2019-11-14 DIAGNOSIS — C9020 Extramedullary plasmacytoma not having achieved remission: Secondary | ICD-10-CM | POA: Diagnosis not present

## 2019-11-14 DIAGNOSIS — Z510 Encounter for antineoplastic radiation therapy: Secondary | ICD-10-CM | POA: Diagnosis not present

## 2019-11-20 DIAGNOSIS — G959 Disease of spinal cord, unspecified: Secondary | ICD-10-CM | POA: Diagnosis not present

## 2019-11-20 DIAGNOSIS — N189 Chronic kidney disease, unspecified: Secondary | ICD-10-CM | POA: Diagnosis not present

## 2019-11-20 DIAGNOSIS — R1310 Dysphagia, unspecified: Secondary | ICD-10-CM | POA: Diagnosis not present

## 2019-11-20 DIAGNOSIS — M8448XA Pathological fracture, other site, initial encounter for fracture: Secondary | ICD-10-CM | POA: Diagnosis not present

## 2019-11-20 DIAGNOSIS — D72829 Elevated white blood cell count, unspecified: Secondary | ICD-10-CM | POA: Diagnosis not present

## 2019-11-20 DIAGNOSIS — C7951 Secondary malignant neoplasm of bone: Secondary | ICD-10-CM | POA: Diagnosis not present

## 2019-11-20 DIAGNOSIS — G9520 Unspecified cord compression: Secondary | ICD-10-CM | POA: Diagnosis not present

## 2019-11-20 DIAGNOSIS — K219 Gastro-esophageal reflux disease without esophagitis: Secondary | ICD-10-CM | POA: Diagnosis not present

## 2019-11-20 DIAGNOSIS — S129XXA Fracture of neck, unspecified, initial encounter: Secondary | ICD-10-CM | POA: Diagnosis not present

## 2019-11-20 DIAGNOSIS — U071 COVID-19: Secondary | ICD-10-CM | POA: Diagnosis not present

## 2019-11-20 DIAGNOSIS — S199XXA Unspecified injury of neck, initial encounter: Secondary | ICD-10-CM | POA: Diagnosis not present

## 2019-11-20 DIAGNOSIS — Z743 Need for continuous supervision: Secondary | ICD-10-CM | POA: Diagnosis not present

## 2019-11-20 DIAGNOSIS — J9601 Acute respiratory failure with hypoxia: Secondary | ICD-10-CM | POA: Diagnosis not present

## 2019-11-20 DIAGNOSIS — I129 Hypertensive chronic kidney disease with stage 1 through stage 4 chronic kidney disease, or unspecified chronic kidney disease: Secondary | ICD-10-CM | POA: Diagnosis not present

## 2019-11-20 DIAGNOSIS — S0990XA Unspecified injury of head, initial encounter: Secondary | ICD-10-CM | POA: Diagnosis not present

## 2019-11-20 DIAGNOSIS — M25521 Pain in right elbow: Secondary | ICD-10-CM | POA: Diagnosis not present

## 2019-11-20 DIAGNOSIS — G9529 Other cord compression: Secondary | ICD-10-CM | POA: Diagnosis not present

## 2019-11-20 DIAGNOSIS — E1122 Type 2 diabetes mellitus with diabetic chronic kidney disease: Secondary | ICD-10-CM | POA: Diagnosis not present

## 2019-11-20 DIAGNOSIS — M47812 Spondylosis without myelopathy or radiculopathy, cervical region: Secondary | ICD-10-CM | POA: Diagnosis not present

## 2019-11-20 DIAGNOSIS — E785 Hyperlipidemia, unspecified: Secondary | ICD-10-CM | POA: Diagnosis not present

## 2019-11-20 DIAGNOSIS — I959 Hypotension, unspecified: Secondary | ICD-10-CM | POA: Diagnosis not present

## 2019-11-20 DIAGNOSIS — M4850XA Collapsed vertebra, not elsewhere classified, site unspecified, initial encounter for fracture: Secondary | ICD-10-CM | POA: Diagnosis not present

## 2019-11-20 DIAGNOSIS — I2510 Atherosclerotic heart disease of native coronary artery without angina pectoris: Secondary | ICD-10-CM | POA: Diagnosis not present

## 2019-11-20 DIAGNOSIS — Z794 Long term (current) use of insulin: Secondary | ICD-10-CM | POA: Diagnosis not present

## 2019-11-20 DIAGNOSIS — C9000 Multiple myeloma not having achieved remission: Secondary | ICD-10-CM | POA: Diagnosis not present

## 2019-11-20 DIAGNOSIS — C9002 Multiple myeloma in relapse: Secondary | ICD-10-CM | POA: Diagnosis not present

## 2019-11-20 DIAGNOSIS — M25511 Pain in right shoulder: Secondary | ICD-10-CM | POA: Diagnosis not present

## 2019-11-20 DIAGNOSIS — Z9989 Dependence on other enabling machines and devices: Secondary | ICD-10-CM | POA: Diagnosis not present

## 2019-11-20 DIAGNOSIS — J9811 Atelectasis: Secondary | ICD-10-CM | POA: Diagnosis not present

## 2019-11-20 DIAGNOSIS — R531 Weakness: Secondary | ICD-10-CM | POA: Diagnosis not present

## 2019-11-20 DIAGNOSIS — E7800 Pure hypercholesterolemia, unspecified: Secondary | ICD-10-CM | POA: Diagnosis not present

## 2019-11-20 DIAGNOSIS — D492 Neoplasm of unspecified behavior of bone, soft tissue, and skin: Secondary | ICD-10-CM | POA: Diagnosis not present

## 2019-11-20 DIAGNOSIS — M4802 Spinal stenosis, cervical region: Secondary | ICD-10-CM | POA: Diagnosis not present

## 2019-11-20 DIAGNOSIS — M5412 Radiculopathy, cervical region: Secondary | ICD-10-CM | POA: Diagnosis not present

## 2019-11-20 DIAGNOSIS — N390 Urinary tract infection, site not specified: Secondary | ICD-10-CM | POA: Diagnosis not present

## 2019-11-20 DIAGNOSIS — M5013 Cervical disc disorder with radiculopathy, cervicothoracic region: Secondary | ICD-10-CM | POA: Diagnosis not present

## 2019-11-20 DIAGNOSIS — R1319 Other dysphagia: Secondary | ICD-10-CM | POA: Diagnosis not present

## 2019-11-20 DIAGNOSIS — D696 Thrombocytopenia, unspecified: Secondary | ICD-10-CM | POA: Diagnosis not present

## 2019-11-20 DIAGNOSIS — S14107A Unspecified injury at C7 level of cervical spinal cord, initial encounter: Secondary | ICD-10-CM | POA: Diagnosis not present

## 2019-11-20 DIAGNOSIS — D649 Anemia, unspecified: Secondary | ICD-10-CM | POA: Diagnosis not present

## 2019-11-20 DIAGNOSIS — S3993XA Unspecified injury of pelvis, initial encounter: Secondary | ICD-10-CM | POA: Diagnosis not present

## 2019-11-20 DIAGNOSIS — M8458XA Pathological fracture in neoplastic disease, other specified site, initial encounter for fracture: Secondary | ICD-10-CM | POA: Diagnosis not present

## 2019-11-20 DIAGNOSIS — M545 Low back pain: Secondary | ICD-10-CM | POA: Diagnosis not present

## 2019-11-20 DIAGNOSIS — R0602 Shortness of breath: Secondary | ICD-10-CM | POA: Diagnosis not present

## 2019-11-20 DIAGNOSIS — M4856XA Collapsed vertebra, not elsewhere classified, lumbar region, initial encounter for fracture: Secondary | ICD-10-CM | POA: Diagnosis not present

## 2019-11-20 DIAGNOSIS — Z880 Allergy status to penicillin: Secondary | ICD-10-CM | POA: Diagnosis not present

## 2019-11-20 DIAGNOSIS — M4712 Other spondylosis with myelopathy, cervical region: Secondary | ICD-10-CM | POA: Diagnosis not present

## 2019-11-29 DIAGNOSIS — S14107A Unspecified injury at C7 level of cervical spinal cord, initial encounter: Secondary | ICD-10-CM | POA: Diagnosis not present

## 2019-11-29 DIAGNOSIS — R6521 Severe sepsis with septic shock: Secondary | ICD-10-CM | POA: Diagnosis not present

## 2019-11-29 DIAGNOSIS — R0602 Shortness of breath: Secondary | ICD-10-CM | POA: Diagnosis not present

## 2019-11-29 DIAGNOSIS — J1282 Pneumonia due to coronavirus disease 2019: Secondary | ICD-10-CM | POA: Diagnosis not present

## 2019-11-29 DIAGNOSIS — S12600A Unspecified displaced fracture of seventh cervical vertebra, initial encounter for closed fracture: Secondary | ICD-10-CM | POA: Diagnosis not present

## 2019-11-29 DIAGNOSIS — M4712 Other spondylosis with myelopathy, cervical region: Secondary | ICD-10-CM | POA: Diagnosis not present

## 2019-11-29 DIAGNOSIS — U071 COVID-19: Secondary | ICD-10-CM | POA: Diagnosis not present

## 2019-11-29 DIAGNOSIS — J9601 Acute respiratory failure with hypoxia: Secondary | ICD-10-CM | POA: Diagnosis not present

## 2019-11-29 DIAGNOSIS — G9529 Other cord compression: Secondary | ICD-10-CM | POA: Diagnosis not present

## 2019-11-29 DIAGNOSIS — C9000 Multiple myeloma not having achieved remission: Secondary | ICD-10-CM | POA: Diagnosis not present

## 2019-11-29 DIAGNOSIS — I10 Essential (primary) hypertension: Secondary | ICD-10-CM | POA: Diagnosis not present

## 2019-11-29 DIAGNOSIS — M4850XA Collapsed vertebra, not elsewhere classified, site unspecified, initial encounter for fracture: Secondary | ICD-10-CM | POA: Diagnosis not present

## 2019-11-29 DIAGNOSIS — A4189 Other specified sepsis: Secondary | ICD-10-CM | POA: Diagnosis not present

## 2019-11-29 DIAGNOSIS — Z743 Need for continuous supervision: Secondary | ICD-10-CM | POA: Diagnosis not present

## 2019-11-29 DIAGNOSIS — N390 Urinary tract infection, site not specified: Secondary | ICD-10-CM | POA: Diagnosis not present

## 2019-11-29 DIAGNOSIS — G9520 Unspecified cord compression: Secondary | ICD-10-CM | POA: Diagnosis not present

## 2019-11-29 DIAGNOSIS — M8448XA Pathological fracture, other site, initial encounter for fracture: Secondary | ICD-10-CM | POA: Diagnosis not present

## 2019-11-29 DIAGNOSIS — E119 Type 2 diabetes mellitus without complications: Secondary | ICD-10-CM | POA: Diagnosis not present

## 2019-11-29 DIAGNOSIS — I469 Cardiac arrest, cause unspecified: Secondary | ICD-10-CM | POA: Diagnosis not present

## 2019-11-29 DIAGNOSIS — C9002 Multiple myeloma in relapse: Secondary | ICD-10-CM | POA: Diagnosis not present

## 2019-11-30 DIAGNOSIS — N390 Urinary tract infection, site not specified: Secondary | ICD-10-CM | POA: Diagnosis not present

## 2019-11-30 DIAGNOSIS — S12600A Unspecified displaced fracture of seventh cervical vertebra, initial encounter for closed fracture: Secondary | ICD-10-CM | POA: Diagnosis not present

## 2019-11-30 DIAGNOSIS — U071 COVID-19: Secondary | ICD-10-CM | POA: Diagnosis not present

## 2019-11-30 DIAGNOSIS — C9000 Multiple myeloma not having achieved remission: Secondary | ICD-10-CM | POA: Diagnosis not present

## 2019-11-30 DIAGNOSIS — E119 Type 2 diabetes mellitus without complications: Secondary | ICD-10-CM | POA: Diagnosis not present

## 2019-12-01 DIAGNOSIS — I469 Cardiac arrest, cause unspecified: Secondary | ICD-10-CM | POA: Diagnosis not present

## 2019-12-01 DIAGNOSIS — U071 COVID-19: Secondary | ICD-10-CM | POA: Diagnosis not present

## 2019-12-01 DIAGNOSIS — I10 Essential (primary) hypertension: Secondary | ICD-10-CM | POA: Diagnosis not present

## 2019-12-01 DIAGNOSIS — J1282 Pneumonia due to coronavirus disease 2019: Secondary | ICD-10-CM | POA: Diagnosis not present

## 2019-12-01 DIAGNOSIS — E119 Type 2 diabetes mellitus without complications: Secondary | ICD-10-CM | POA: Diagnosis not present

## 2019-12-01 DIAGNOSIS — J9601 Acute respiratory failure with hypoxia: Secondary | ICD-10-CM | POA: Diagnosis not present

## 2019-12-01 DIAGNOSIS — R0602 Shortness of breath: Secondary | ICD-10-CM | POA: Diagnosis not present

## 2019-12-01 DIAGNOSIS — R6521 Severe sepsis with septic shock: Secondary | ICD-10-CM | POA: Diagnosis not present

## 2019-12-01 DIAGNOSIS — A4189 Other specified sepsis: Secondary | ICD-10-CM | POA: Diagnosis not present

## 2019-12-28 DEATH — deceased

## 2020-03-27 IMAGING — DX DG CHEST 2V
2 series · 2 of 2 positions shown · non-contrast
Comparison: None.

CLINICAL DATA: Acute onset of left posterior chest pain. High blood
pressure.

EXAM:
CHEST - 2 VIEW

[chest pa]
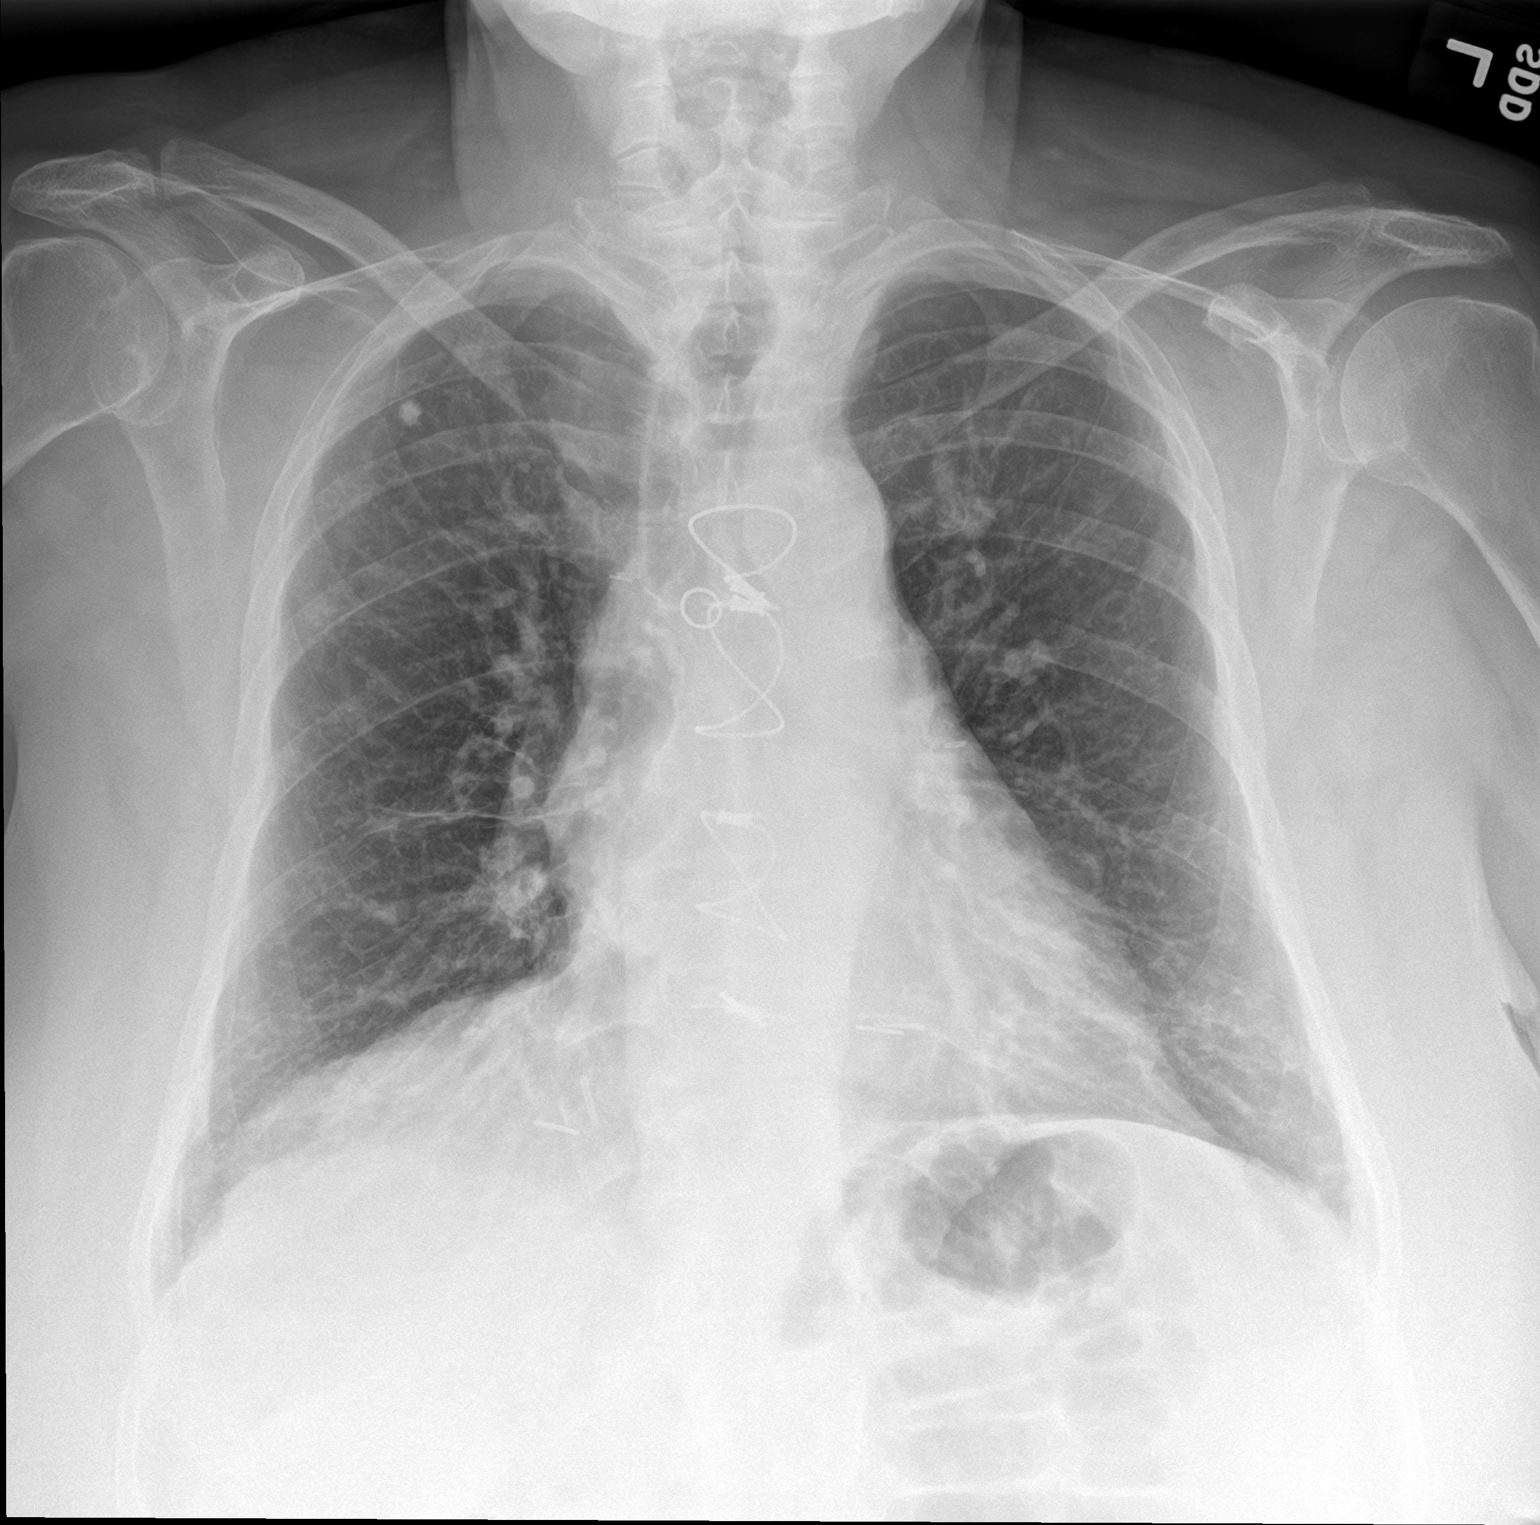

[chest lat]
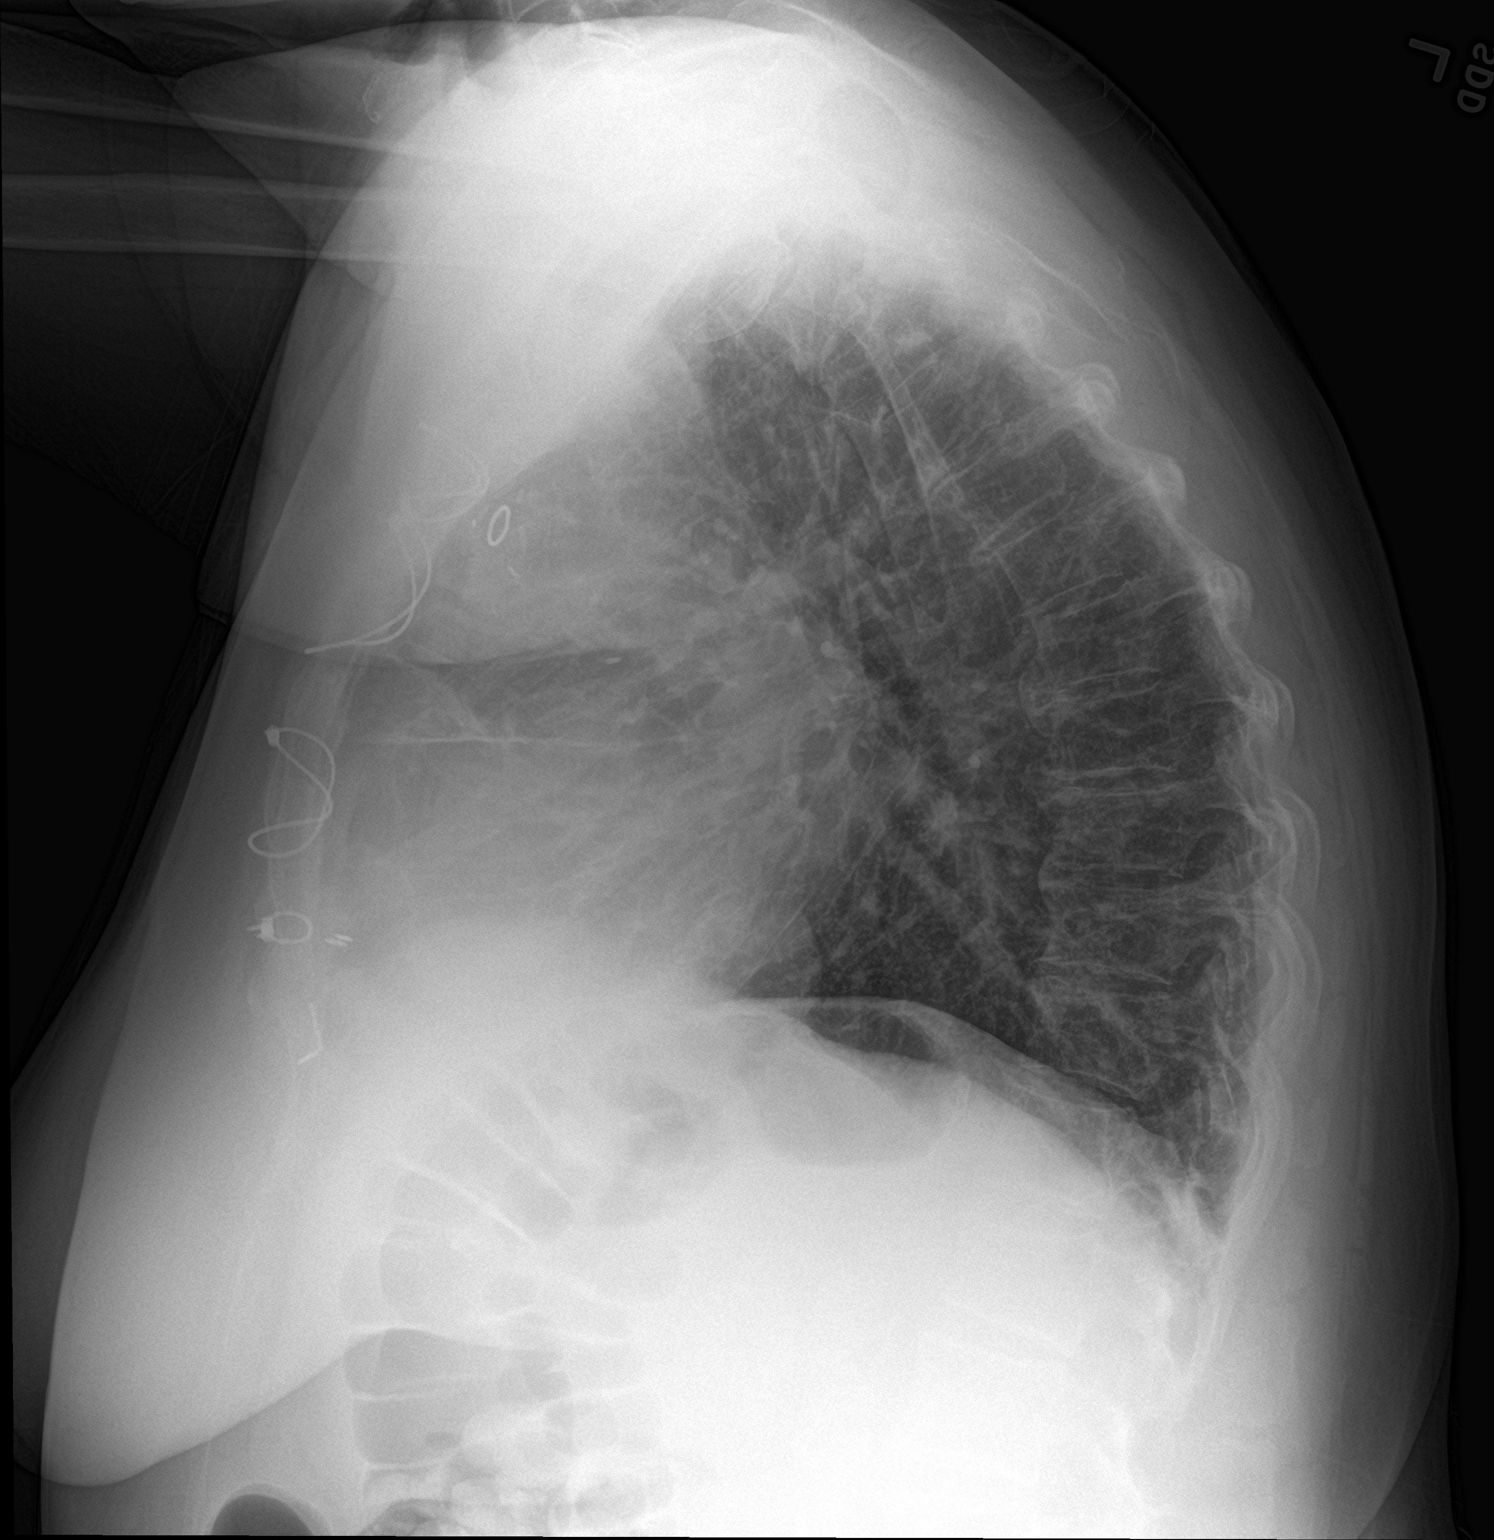

[2 of 2 positions shown; findings below may reference images not displayed]

FINDINGS: The lungs are well-aerated. Vascular congestion is noted. Minimal
right midlung and left basilar airspace opacities may reflect
atelectasis or possibly mild infection. There is no evidence of
pleural effusion or pneumothorax.

The heart is normal in size; the patient is status post median
sternotomy. No acute osseous abnormalities are seen.
IMPRESSION: Vascular congestion noted. Minimal right midlung and left basilar
airspace opacities may reflect atelectasis or possibly mild
infection.

## 2020-03-28 IMAGING — CT CT RENAL STONE PROTOCOL
2 of 5 series · 16 of 46 positions shown, 18 images · non-contrast
Comparison: None.

CLINICAL DATA: Acute onset of lower back pain.

EXAM:
CT ABDOMEN AND PELVIS WITHOUT CONTRAST
TECHNIQUE: Multidetector CT imaging of the abdomen and pelvis was performed
following the standard protocol without IV contrast.

[Series 2: axial st · axial · 0.82mm/px · z∈[+969,+1369]mm · 13 of 88 slices shown, 15 images]
[im 4/88  soft-tissue]
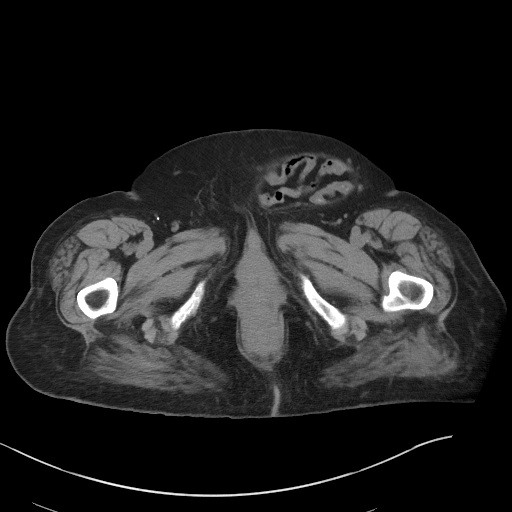
[im 4/88  bone]
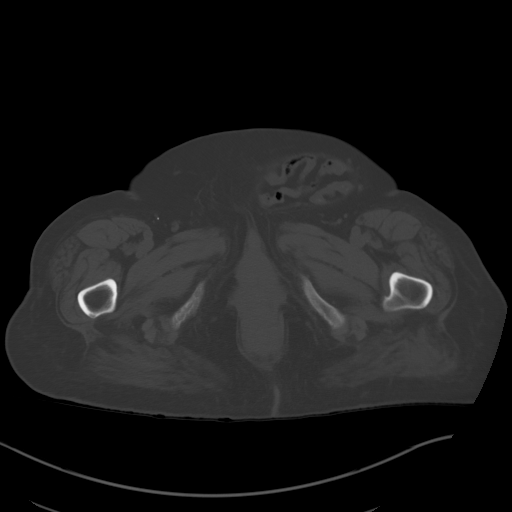
[im 12/88  soft-tissue]
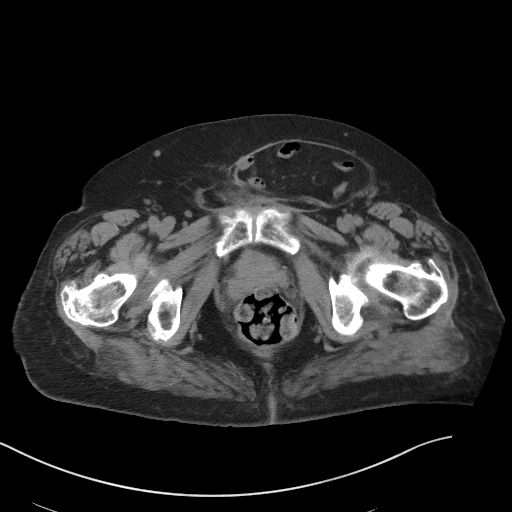
[im 20/88  soft-tissue]
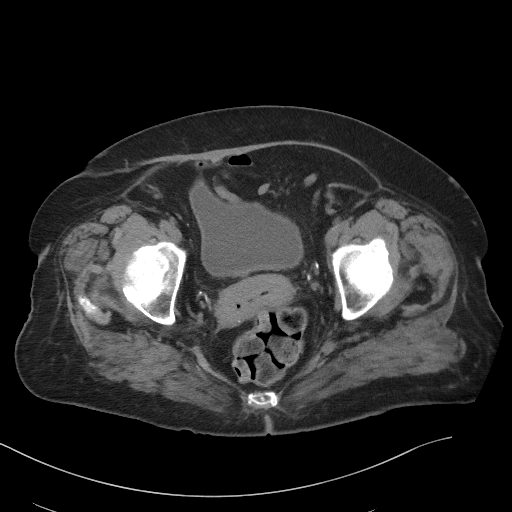
[im 24/88  soft-tissue]
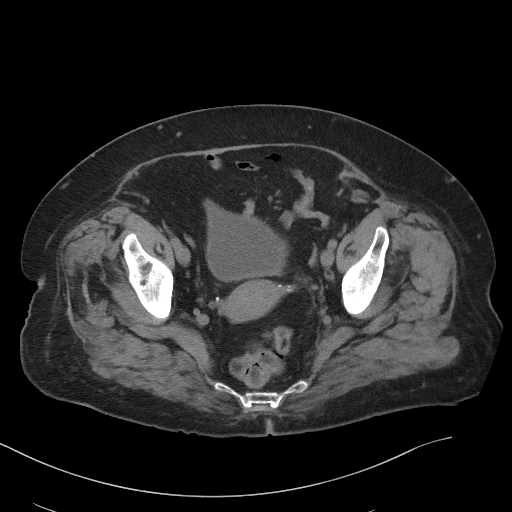
[im 32/88  soft-tissue]
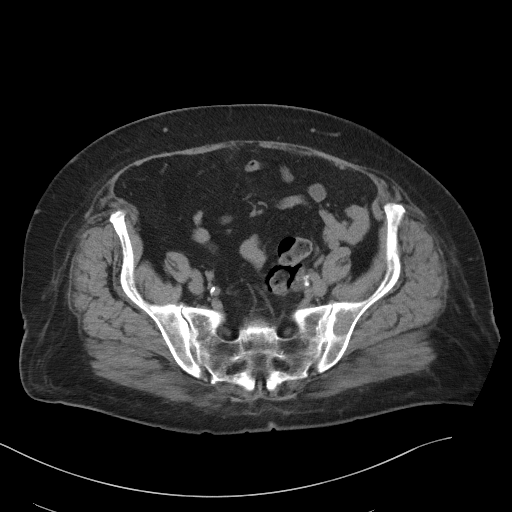
[im 36/88  soft-tissue]
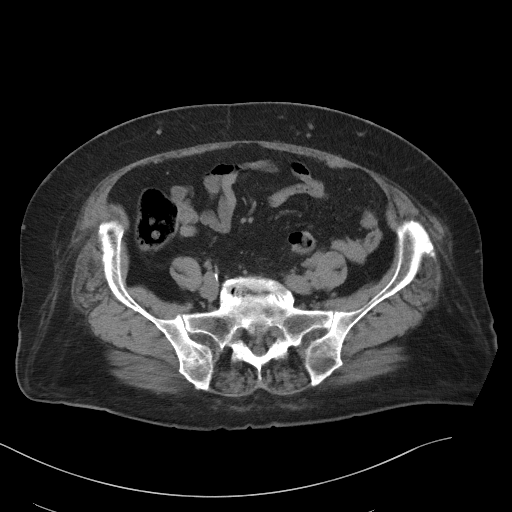
[im 44/88  soft-tissue]
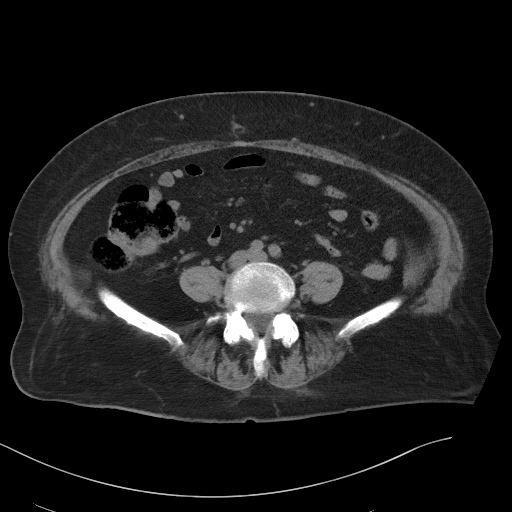
[im 52/88  soft-tissue]
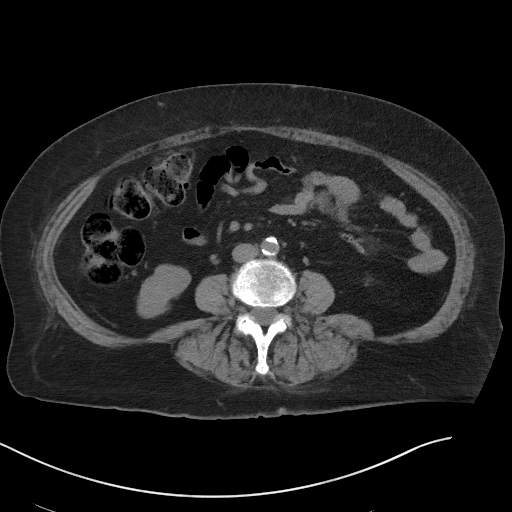
[im 56/88  soft-tissue]
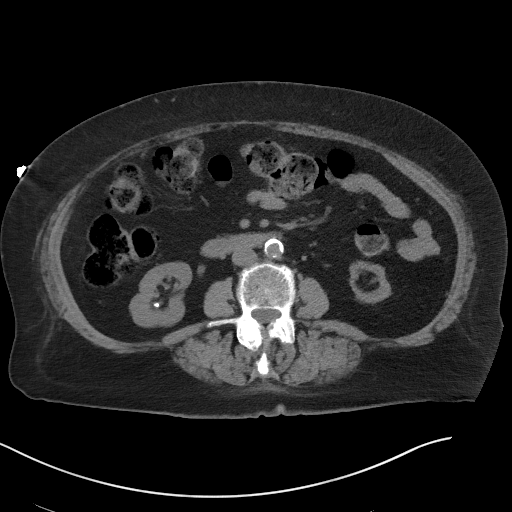
[im 56/88  bone]
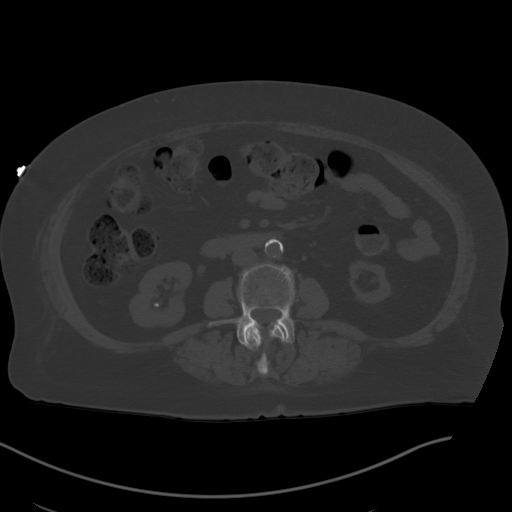
[im 64/88  soft-tissue]
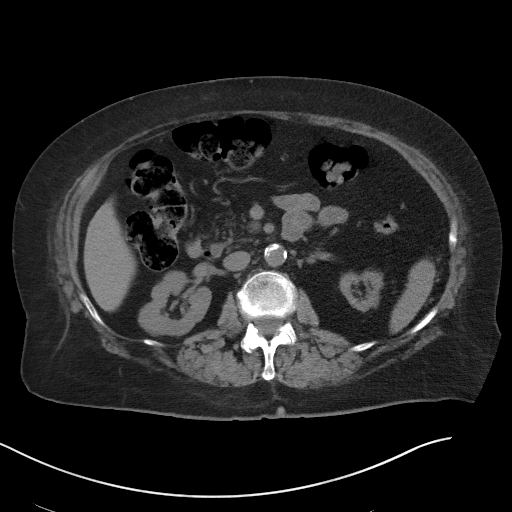
[im 68/88  soft-tissue]
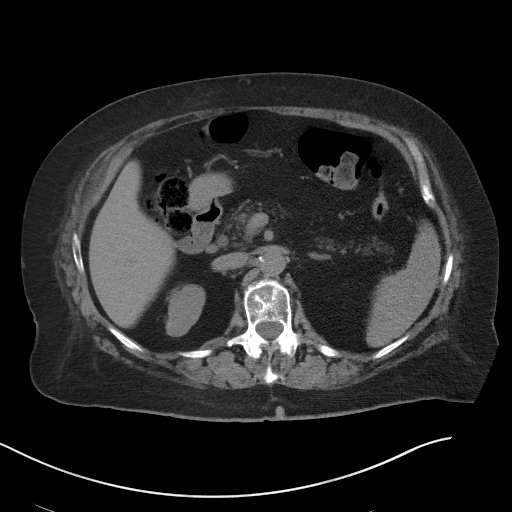
[im 76/88  soft-tissue]
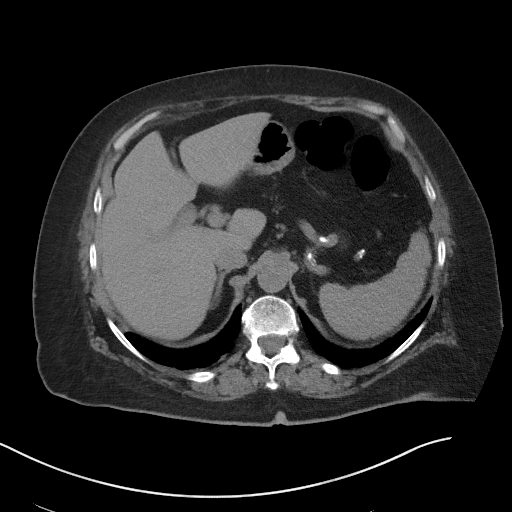
[im 84/88  soft-tissue]
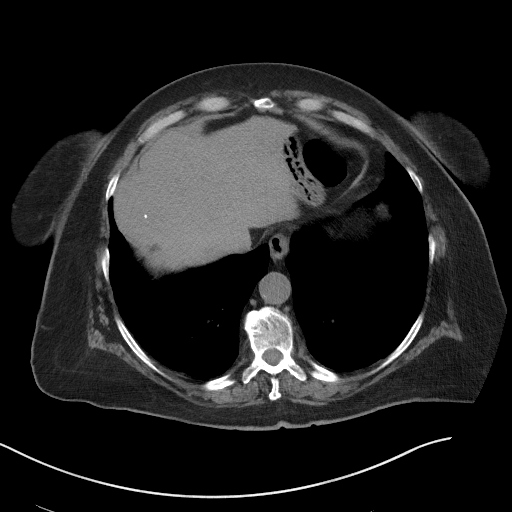

[Series 5: coronal st · coronal · 0.90mm/px · 3 of 106 slices shown]
[im 36/106  soft-tissue]
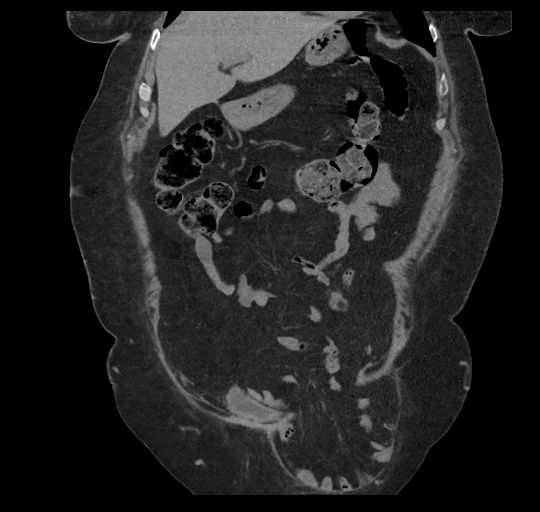
[im 47/106  soft-tissue]
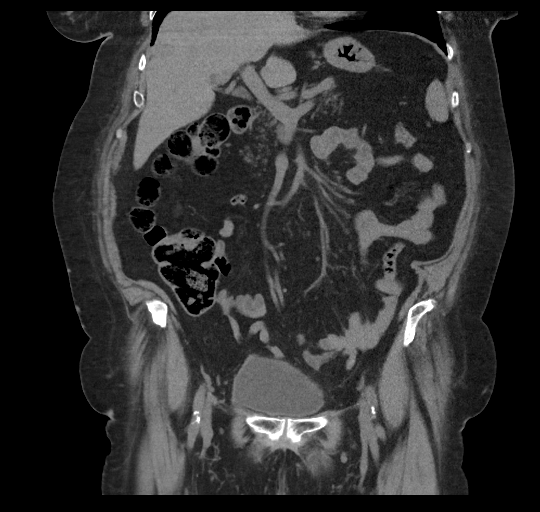
[im 59/106  soft-tissue]
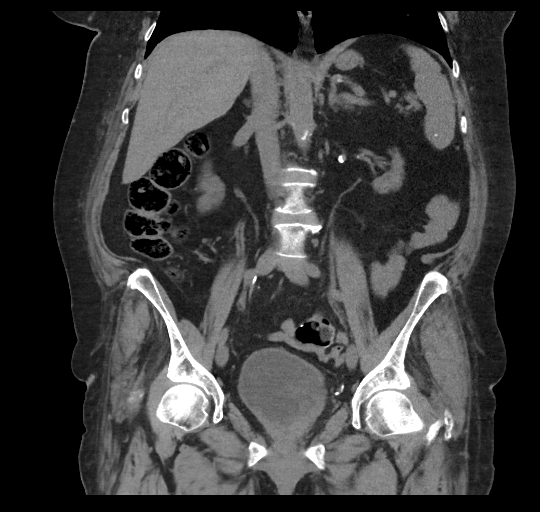

[16 of 46 positions shown; findings below may reference images not displayed]

FINDINGS: Lower chest: The visualized lung bases are grossly clear. The
visualized portions of the mediastinum are unremarkable.

Hepatobiliary: A small calcified granuloma is noted at the right
hepatic lobe. The liver is otherwise unremarkable. The patient is
status post cholecystectomy. The common bile duct is normal in
caliber.

Pancreas: The pancreas is within normal limits.

Spleen: The spleen is unremarkable in appearance.

Adrenals/Urinary Tract: The adrenal glands are unremarkable in
appearance.

Nonobstructing right renal stones measure up to 6 mm in size. There
is severe left renal atrophy. No significant perinephric stranding
is seen. No obstructing ureteral stones are identified.

Stomach/Bowel: The stomach is unremarkable in appearance. The small
bowel is within normal limits. The appendix is not visualized; there
is no evidence for appendicitis. The colon is unremarkable in
appearance.

Vascular/Lymphatic: Scattered calcification is seen along the
abdominal aorta and its branches. The abdominal aorta is otherwise
grossly unremarkable. The inferior vena cava is grossly
unremarkable. No retroperitoneal lymphadenopathy is seen. No pelvic
sidewall lymphadenopathy is identified.

Reproductive: The bladder is mildly distended and grossly
unremarkable. The patient is status post hysterectomy. No suspicious
adnexal masses are seen.

Other: Note is made of a moderate to large left inguinal hernia,
containing multiple segments of small bowel. There is no evidence
for bowel obstruction.

Musculoskeletal: No acute osseous abnormalities are identified.
There is chronic loss of height at vertebral body L1. The visualized
musculature is unremarkable in appearance.
IMPRESSION: 1. No acute abnormality seen to explain the patient's symptoms.
Chronic loss of height noted at vertebral body L1.
2. Moderate to large left inguinal hernia, containing multiple
segments of small bowel. No evidence for bowel obstruction.
3. Severe left renal atrophy. Nonobstructing right renal stones
measure up to 6 mm in size.

Aortic Atherosclerosis ([id number]).
